# Patient Record
Sex: Female | Born: 1975 | Race: Black or African American | Hispanic: No | Marital: Single | Smoking: Light tobacco smoker
Health system: Southern US, Community
[De-identification: ages and names within clinical notes are randomized; demographics above are authoritative.]

## PROBLEM LIST (undated history)

## (undated) DIAGNOSIS — F329 Major depressive disorder, single episode, unspecified: Secondary | ICD-10-CM

## (undated) DIAGNOSIS — F32A Depression, unspecified: Secondary | ICD-10-CM

## (undated) DIAGNOSIS — C259 Malignant neoplasm of pancreas, unspecified: Secondary | ICD-10-CM

## (undated) DIAGNOSIS — F419 Anxiety disorder, unspecified: Secondary | ICD-10-CM

## (undated) DIAGNOSIS — M549 Dorsalgia, unspecified: Secondary | ICD-10-CM

## (undated) HISTORY — PX: TUBAL LIGATION: SHX77

## (undated) HISTORY — PX: DILATION AND CURETTAGE OF UTERUS: SHX78

## (undated) HISTORY — PX: PANCREAS SURGERY: SHX731

---

## 2009-12-10 ENCOUNTER — Emergency Department (HOSPITAL_BASED_OUTPATIENT_CLINIC_OR_DEPARTMENT_OTHER): Admission: EM | Admit: 2009-12-10 | Discharge: 2009-12-10 | Payer: Self-pay | Admitting: Emergency Medicine

## 2009-12-16 ENCOUNTER — Emergency Department (HOSPITAL_BASED_OUTPATIENT_CLINIC_OR_DEPARTMENT_OTHER): Admission: EM | Admit: 2009-12-16 | Discharge: 2009-12-16 | Payer: Self-pay | Admitting: Emergency Medicine

## 2010-01-19 ENCOUNTER — Emergency Department (HOSPITAL_BASED_OUTPATIENT_CLINIC_OR_DEPARTMENT_OTHER): Admission: EM | Admit: 2010-01-19 | Discharge: 2010-01-20 | Payer: Self-pay | Admitting: Emergency Medicine

## 2010-01-21 ENCOUNTER — Inpatient Hospital Stay (HOSPITAL_COMMUNITY): Admission: AD | Admit: 2010-01-21 | Discharge: 2010-01-21 | Payer: Self-pay | Admitting: Obstetrics & Gynecology

## 2011-02-17 LAB — RAPID STREP SCREEN (MED CTR MEBANE ONLY): Streptococcus, Group A Screen (Direct): NEGATIVE

## 2011-02-21 LAB — URINALYSIS, ROUTINE W REFLEX MICROSCOPIC
Bilirubin Urine: NEGATIVE
Glucose, UA: NEGATIVE mg/dL
Hgb urine dipstick: NEGATIVE
Nitrite: NEGATIVE
Protein, ur: NEGATIVE mg/dL
Urobilinogen, UA: 1 mg/dL (ref 0.0–1.0)

## 2011-02-22 LAB — CBC
HCT: 37 % (ref 36.0–46.0)
Hemoglobin: 12.1 g/dL (ref 12.0–15.0)
RBC: 3.83 MIL/uL — ABNORMAL LOW (ref 3.87–5.11)
WBC: 10.3 10*3/uL (ref 4.0–10.5)

## 2011-02-22 LAB — WET PREP, GENITAL: Trich, Wet Prep: NONE SEEN

## 2011-02-22 LAB — GC/CHLAMYDIA PROBE AMP, GENITAL: GC Probe Amp, Genital: NEGATIVE

## 2012-09-10 ENCOUNTER — Emergency Department (HOSPITAL_BASED_OUTPATIENT_CLINIC_OR_DEPARTMENT_OTHER)
Admission: EM | Admit: 2012-09-10 | Discharge: 2012-09-10 | Disposition: A | Discharge status: Home / Self Care | Payer: Self-pay | Attending: Emergency Medicine | Admitting: Emergency Medicine

## 2012-09-10 ENCOUNTER — Encounter (HOSPITAL_BASED_OUTPATIENT_CLINIC_OR_DEPARTMENT_OTHER): Payer: Self-pay | Admitting: *Deleted

## 2012-09-10 ENCOUNTER — Inpatient Hospital Stay (HOSPITAL_COMMUNITY): Payer: Self-pay

## 2012-09-10 ENCOUNTER — Encounter (HOSPITAL_COMMUNITY): Payer: Self-pay | Admitting: *Deleted

## 2012-09-10 ENCOUNTER — Inpatient Hospital Stay (HOSPITAL_COMMUNITY)
Admission: AD | Admit: 2012-09-10 | Discharge: 2012-09-10 | Disposition: A | Discharge status: Home / Self Care | Payer: Self-pay | Source: Ambulatory Visit | Attending: Obstetrics & Gynecology | Admitting: Obstetrics & Gynecology

## 2012-09-10 DIAGNOSIS — O2390 Unspecified genitourinary tract infection in pregnancy, unspecified trimester: Secondary | ICD-10-CM | POA: Insufficient documentation

## 2012-09-10 DIAGNOSIS — A599 Trichomoniasis, unspecified: Secondary | ICD-10-CM | POA: Insufficient documentation

## 2012-09-10 DIAGNOSIS — O200 Threatened abortion: Secondary | ICD-10-CM | POA: Insufficient documentation

## 2012-09-10 DIAGNOSIS — O98819 Other maternal infectious and parasitic diseases complicating pregnancy, unspecified trimester: Secondary | ICD-10-CM | POA: Insufficient documentation

## 2012-09-10 DIAGNOSIS — A5901 Trichomonal vulvovaginitis: Secondary | ICD-10-CM | POA: Insufficient documentation

## 2012-09-10 DIAGNOSIS — O039 Complete or unspecified spontaneous abortion without complication: Secondary | ICD-10-CM

## 2012-09-10 DIAGNOSIS — N760 Acute vaginitis: Secondary | ICD-10-CM | POA: Insufficient documentation

## 2012-09-10 HISTORY — DX: Malignant neoplasm of pancreas, unspecified: C25.9

## 2012-09-10 HISTORY — DX: Dorsalgia, unspecified: M54.9

## 2012-09-10 LAB — WET PREP, GENITAL
Clue Cells Wet Prep HPF POC: NONE SEEN
Yeast Wet Prep HPF POC: NONE SEEN

## 2012-09-10 LAB — CBC WITH DIFFERENTIAL/PLATELET
Basophils Relative: 0 % (ref 0–1)
Eosinophils Absolute: 0.3 10*3/uL (ref 0.0–0.7)
Eosinophils Relative: 2 % (ref 0–5)
HCT: 33.2 % — ABNORMAL LOW (ref 36.0–46.0)
Lymphocytes Relative: 32 % (ref 12–46)
MCH: 31.8 pg (ref 26.0–34.0)
MCV: 90.2 fL (ref 78.0–100.0)
Monocytes Absolute: 0.9 10*3/uL (ref 0.1–1.0)
RDW: 13.4 % (ref 11.5–15.5)
WBC: 11.1 10*3/uL — ABNORMAL HIGH (ref 4.0–10.5)

## 2012-09-10 LAB — HCG, QUANTITATIVE, PREGNANCY: hCG, Beta Chain, Quant, S: 1855 m[IU]/mL — ABNORMAL HIGH (ref <5)

## 2012-09-10 LAB — URINALYSIS, ROUTINE W REFLEX MICROSCOPIC
Nitrite: NEGATIVE
Specific Gravity, Urine: 1.008 (ref 1.005–1.030)
Urobilinogen, UA: 1 mg/dL (ref 0.0–1.0)

## 2012-09-10 LAB — BASIC METABOLIC PANEL
BUN: 8 mg/dL (ref 6–23)
CO2: 19 mEq/L (ref 19–32)
GFR calc Af Amer: >90 mL/min (ref >90)
Glucose, Bld: 116 mg/dL — ABNORMAL HIGH (ref 70–99)
Potassium: 3.5 mEq/L (ref 3.5–5.1)

## 2012-09-10 LAB — URINE MICROSCOPIC-ADD ON

## 2012-09-10 LAB — GC/CHLAMYDIA PROBE AMP, GENITAL
Chlamydia, DNA Probe: NEGATIVE
GC Probe Amp, Genital: NEGATIVE

## 2012-09-10 LAB — ABO/RH: ABO/RH(D): B POS

## 2012-09-10 MED ORDER — METRONIDAZOLE 500 MG PO TABS
2000.0000 mg | ORAL_TABLET | Freq: Once | ORAL | Status: AC
  Administered 2012-09-10: 2000 mg via ORAL
  Filled 2012-09-10: qty 4

## 2012-09-10 MED ORDER — AZITHROMYCIN 1 G PO PACK
1.0000 g | PACK | Freq: Once | ORAL | Status: AC
  Administered 2012-09-10: 1 g via ORAL
  Filled 2012-09-10: qty 1

## 2012-09-10 MED ORDER — CEFTRIAXONE SODIUM 250 MG IJ SOLR
250.0000 mg | Freq: Once | INTRAMUSCULAR | Status: AC
  Administered 2012-09-10: 250 mg via INTRAMUSCULAR
  Filled 2012-09-10: qty 250

## 2012-09-10 MED ORDER — KETOROLAC TROMETHAMINE 60 MG/2ML IM SOLN
30.0000 mg | Freq: Once | INTRAMUSCULAR | Status: AC
  Administered 2012-09-10: 30 mg via INTRAMUSCULAR
  Filled 2012-09-10: qty 2

## 2012-09-10 NOTE — MAU Provider Note (Signed)
History     CSN: [id number]  Arrival date and time: 09/10/12 0210   None     Chief Complaint  Patient presents with  . Vaginal Bleeding   HPI Ashlee Coleman is 36 y.o. G1P0 Unknown weeks presenting after evaluation at [facility] for heavy vaginal bleeding in early pregnancy.  All labs, exam, and treatment for Trichomonas, Gc/Chl were done prior to her arrival per report of Dr. Nicanor Alcon to me.  All labs are now back.  She is here for ultrasound evaluation.  Patient states she passed a large amount of something and now her bleeding has slowed and she is having only back pain.  SHe is stable for ultrasound.    Past Medical History  Diagnosis Date  . Pancreatic cancer   . Back pain     Past Surgical History  Procedure Date  . Dilation and curettage of uterus     Family History  Problem Relation Age of Onset  . Hypertension Father     History  Substance Use Topics  . Smoking status: Current Every Day Smoker -- 0.5 packs/day  . Smokeless tobacco: Not on file  . Alcohol Use: No    Allergies: No Known Allergies  Prescriptions prior to admission  Medication Sig Dispense Refill  . gabapentin (NEURONTIN) 100 MG capsule Take 100 mg by mouth 3 (three) times daily.        Review of Systems  Constitutional: Negative.   HENT: Negative.   Respiratory: Negative.   Cardiovascular: Negative.   Genitourinary:       + for heavy vaginal bleeding with ? Tissue/clot  Musculoskeletal: Positive for back pain.   Physical Exam   Blood pressure 125/85, pulse 89, temperature 97.1 F (36.2 C), temperature source Oral, resp. rate 18, last menstrual period 07/11/2012.  Physical Exam  Constitutional: She is oriented to person, place, and time. She appears well-developed and well-nourished. No distress.  HENT:  Head: Normocephalic.  Neck: Normal range of motion.  Cardiovascular: Normal rate.   Respiratory: Effort normal.  GI: There is no tenderness. There is no  rebound and no guarding.  Genitourinary: Uterus is enlarged (slightly, soft). Uterus is not tender. Cervix exhibits discharge. Cervix exhibits no motion tenderness and no friability. Right adnexum displays no mass, no tenderness and no fullness. Left adnexum displays no mass, no tenderness and no fullness. There is bleeding (scant blood in vaginal canal--less than one scopette full) around the vagina. No tenderness around the vagina.  Musculoskeletal:       Lower back pain  Neurological: She is alert and oriented to person, place, and time.  Skin: Skin is warm and dry.  Psychiatric: She has a normal mood and affect. Her behavior is normal.   Results for orders placed during the hospital encounter of 09/10/12 (from the past 24 hour(s))  PREGNANCY, URINE     Status: Abnormal   Collection Time   09/10/12 12:35 AM      Component Value Range   Preg Test, Ur POSITIVE (*) NEGATIVE  URINALYSIS, ROUTINE W REFLEX MICROSCOPIC     Status: Abnormal   Collection Time   09/10/12 12:35 AM      Component Value Range   Color, Urine YELLOW  YELLOW   APPearance CLOUDY (*) CLEAR   Specific Gravity, Urine 1.008  1.005 - 1.030   pH 6.0  5.0 - 8.0   Glucose, UA NEGATIVE  NEGATIVE mg/dL   Hgb urine dipstick LARGE (*) NEGATIVE  Bilirubin Urine NEGATIVE  NEGATIVE   Ketones, ur NEGATIVE  NEGATIVE mg/dL   Protein, ur NEGATIVE  NEGATIVE mg/dL   Urobilinogen, UA 1.0  0.0 - 1.0 mg/dL   Nitrite NEGATIVE  NEGATIVE   Leukocytes, UA MODERATE (*) NEGATIVE  URINE MICROSCOPIC-ADD ON     Status: Abnormal   Collection Time   09/10/12 12:35 AM      Component Value Range   Squamous Epithelial / LPF FEW (*) RARE   WBC, UA 7-10  <3 WBC/hpf   RBC / HPF 21-50  <3 RBC/hpf   Bacteria, UA MANY (*) RARE   Urine-Other TRICHOMONAS PRESENT    WET PREP, GENITAL     Status: Abnormal   Collection Time   09/10/12 12:43 AM      Component Value Range   Yeast Wet Prep HPF POC NONE SEEN  NONE SEEN   Trich, Wet Prep FEW (*) NONE SEEN    Clue Cells Wet Prep HPF POC NONE SEEN  NONE SEEN   WBC, Wet Prep HPF POC FEW (*) NONE SEEN  CBC WITH DIFFERENTIAL     Status: Abnormal   Collection Time   09/10/12  1:04 AM      Component Value Range   WBC 11.1 (*) 4.0 - 10.5 K/uL   RBC 3.68 (*) 3.87 - 5.11 MIL/uL   Hemoglobin 11.7 (*) 12.0 - 15.0 g/dL   HCT 40.9 (*) 81.1 - 91.4 %   MCV 90.2  78.0 - 100.0 fL   MCH 31.8  26.0 - 34.0 pg   MCHC 35.2  30.0 - 36.0 g/dL   RDW 78.2  95.6 - 21.3 %   Platelets 405 (*) 150 - 400 K/uL   Neutrophils Relative 57  43 - 77 %   Neutro Abs 6.3  1.7 - 7.7 K/uL   Lymphocytes Relative 32  12 - 46 %   Lymphs Abs 3.6  0.7 - 4.0 K/uL   Monocytes Relative 8  3 - 12 %   Monocytes Absolute 0.9  0.1 - 1.0 K/uL   Eosinophils Relative 2  0 - 5 %   Eosinophils Absolute 0.3  0.0 - 0.7 K/uL   Basophils Relative 0  0 - 1 %   Basophils Absolute 0.0  0.0 - 0.1 K/uL  BASIC METABOLIC PANEL     Status: Abnormal   Collection Time   09/10/12  1:04 AM      Component Value Range   Sodium 137  135 - 145 mEq/L   Potassium 3.5  3.5 - 5.1 mEq/L   Chloride 103  96 - 112 mEq/L   CO2 19  19 - 32 mEq/L   Glucose, Bld 116 (*) 70 - 99 mg/dL   BUN 8  6 - 23 mg/dL   Creatinine, Ser 0.86  0.50 - 1.10 mg/dL   Calcium 9.1  8.4 - 57.8 mg/dL   GFR calc non Af Amer >90  >90 mL/min   GFR calc Af Amer >90  >90 mL/min  HCG, QUANTITATIVE, PREGNANCY     Status: Abnormal   Collection Time   09/10/12  1:05 AM      Component Value Range   hCG, Beta Chain, Quant, S 1855 (*) <5 mIU/mL   Clinical Data: 36 year old female with bleeding. Quantitative beta  HCG 1855.  OBSTETRIC <14 WK [place] AND TRANSVAGINAL OB US  Technique: Both transabdominal and transvaginal ultrasound  examinations were performed for complete evaluation of the  gestation as well as the maternal  uterus, adnexal regions, and  pelvic cul-de-sac. Transvaginal technique was performed to assess  early pregnancy.  Comparison: None relevant.  Intrauterine gestational sac:  None  Yolk sac: None  Embryo: None  Cardiac Activity: None  Maternal uterus/adnexae:  No pelvic free fluid. Fairly homogeneous endometrial stripe up to  12 mm in thickness. The left ovary appears normal measuring 2.1 x  3.9 x 1.8 cm. The right ovary appears normal measuring 2.7 x 1.2 x  1.8 cm. Incidental Nabothian cyst of the cervix.  IMPRESSION:  No IUP, free fluid, or adnexal mass identified. Ectopic pregnancy  not excluded. Recommend correlation with serial quantitative BHCG  and followup imaging.  Original Report Authenticated By: Harley Hallmark, M.D.     MAU Course  Procedures  MDM Patient was treated for Trichomonas, GC/CHL [facility]   After ultrasound results--Toradol 30mg  IM was ordered for back pain.   Assessment and Plan  A:  Heavy vaginal bleeding in early pregnancy      Spontaneous miscarriage     Trichomonas vaginitis-treated at [facility]        P:  Follow up for repeat BHCG in 2 weeks in the GYN CLINIC    Instructed to call for heavier bleeding or increased pain     Pelvic rest until bleeding stops   Graham Hyun,EVE M 09/10/2012, 2:25 AM

## 2012-09-10 NOTE — ED Provider Notes (Signed)
History     CSN: [id number]  Arrival date & time 09/10/12  0019   First MD Initiated Contact with Patient 09/10/12 0032      Chief Complaint  Patient presents with  . Vaginal Bleeding    (Consider location/radiation/quality/duration/timing/severity/associated sxs/prior treatment) Patient is a 36 y.o. female presenting with vaginal bleeding. The history is provided by the patient. No language interpreter was used.  Vaginal Bleeding This is a new problem. The current episode started more than 2 days ago. The problem occurs constantly. The problem has been gradually worsening. Pertinent negatives include no chest pain, no headaches and no shortness of breath. Nothing aggravates the symptoms. Nothing relieves the symptoms. She has tried nothing for the symptoms. The treatment provided no relief.  Had a missed period on September 11 and is usually regular.  Increased bleeding and soaked 12 pads today.    Past Medical History  Diagnosis Date  . Pancreatic cancer   . Back pain     Past Surgical History  Procedure Date  . Dilation and curettage of uterus     History reviewed. No pertinent family history.  History  Substance Use Topics  . Smoking status: Current Every Day Smoker -- 0.5 packs/day  . Smokeless tobacco: Not on file  . Alcohol Use: No    OB History    Grav Para Term Preterm Abortions TAB SAB Ect Mult Living                  Review of Systems  Respiratory: Negative for shortness of breath.   Cardiovascular: Negative for chest pain.  Genitourinary: Positive for vaginal bleeding.  Neurological: Negative for headaches.  All other systems reviewed and are negative.    Allergies  Review of patient's allergies indicates no known allergies.  Home Medications   Current Outpatient Rx  Name Route Sig Dispense Refill  . GABAPENTIN 100 MG PO CAPS Oral Take 100 mg by mouth 3 (three) times daily.      BP 122/88  Pulse 92  Temp 98.2 F (36.8 C) (Oral)  Resp  16  Ht 5\' 5"  (1.651 m)  Wt 196 lb (88.905 kg)  BMI 32.62 kg/m2  SpO2 98%  LMP 07/11/2012  Physical Exam  Constitutional: She is oriented to person, place, and time. She appears well-developed and well-nourished.  HENT:  Head: Normocephalic and atraumatic.  Mouth/Throat: Oropharynx is clear and moist.  Eyes: Conjunctivae normal are normal. Pupils are equal, round, and reactive to light.  Neck: Normal range of motion. Neck supple.  Cardiovascular: Normal rate and regular rhythm.   Pulmonary/Chest: Effort normal and breath sounds normal. She has no wheezes. She has no rales.  Abdominal: Soft. Bowel sounds are normal. There is no tenderness. There is no rebound and no guarding.  Genitourinary:       Scant vaginal bleeding os closed.  Chaperone present  Musculoskeletal: Normal range of motion.  Neurological: She is oriented to person, place, and time.  Skin: Skin is warm and dry.  Psychiatric: She has a normal mood and affect.    ED Course  Procedures (including critical care time)  Labs Reviewed  PREGNANCY, URINE - Abnormal; Notable for the following:    Preg Test, Ur POSITIVE (*)     All other components within normal limits  WET PREP, GENITAL - Abnormal; Notable for the following:    Trich, Wet Prep FEW (*)     WBC, Wet Prep HPF POC FEW (*)     All  other components within normal limits  URINALYSIS, ROUTINE W REFLEX MICROSCOPIC - Abnormal; Notable for the following:    APPearance CLOUDY (*)     Hgb urine dipstick LARGE (*)     Leukocytes, UA MODERATE (*)     All other components within normal limits  URINE MICROSCOPIC-ADD ON - Abnormal; Notable for the following:    Squamous Epithelial / LPF FEW (*)     Bacteria, UA MANY (*)     All other components within normal limits  GC/CHLAMYDIA PROBE AMP, GENITAL  URINE CULTURE  CBC WITH DIFFERENTIAL  BASIC METABOLIC PANEL  ABO/RH  HCG, QUANTITATIVE, PREGNANCY   No results found.   No diagnosis found.    MDM  Case d/w  Dr. Despina Hidden  And Eve in MAU.  Have treated for GC chlamydia and trichomonas.  Quant and RH pending.  Send for [place] for r/o ectopic        Koriana Stepien K Coree Brame-Rasch, MD 09/10/12 0128

## 2012-09-10 NOTE — ED Notes (Signed)
Pt states she is pregnant with no visit by MD only missed period , LMP sept 11. C/o vaginal bleeding x 2 days

## 2012-09-10 NOTE — MAU Note (Signed)
Pt sent from ER for ultrasound

## 2012-09-11 LAB — URINE CULTURE: Culture: NO GROWTH

## 2012-09-14 ENCOUNTER — Encounter (HOSPITAL_COMMUNITY): Payer: Self-pay | Admitting: *Deleted

## 2012-09-14 ENCOUNTER — Inpatient Hospital Stay (HOSPITAL_COMMUNITY)
Admission: AD | Admit: 2012-09-14 | Discharge: 2012-09-14 | Disposition: A | Discharge status: Home / Self Care | Payer: Self-pay | Source: Ambulatory Visit | Attending: Obstetrics and Gynecology | Admitting: Obstetrics and Gynecology

## 2012-09-14 DIAGNOSIS — O039 Complete or unspecified spontaneous abortion without complication: Secondary | ICD-10-CM

## 2012-09-14 DIAGNOSIS — R42 Dizziness and giddiness: Secondary | ICD-10-CM | POA: Insufficient documentation

## 2012-09-14 HISTORY — DX: Major depressive disorder, single episode, unspecified: F32.9

## 2012-09-14 HISTORY — DX: Depression, unspecified: F32.A

## 2012-09-14 HISTORY — DX: Anxiety disorder, unspecified: F41.9

## 2012-09-14 LAB — COMPREHENSIVE METABOLIC PANEL
Alkaline Phosphatase: 67 U/L (ref 39–117)
BUN: 10 mg/dL (ref 6–23)
CO2: 21 mEq/L (ref 19–32)
Calcium: 8.7 mg/dL (ref 8.4–10.5)
GFR calc Af Amer: >90 mL/min (ref >90)
GFR calc non Af Amer: >90 mL/min (ref >90)
Glucose, Bld: 123 mg/dL — ABNORMAL HIGH (ref 70–99)
Potassium: 4.1 mEq/L (ref 3.5–5.1)
Total Protein: 6.8 g/dL (ref 6.0–8.3)

## 2012-09-14 LAB — URINALYSIS, ROUTINE W REFLEX MICROSCOPIC
Protein, ur: NEGATIVE mg/dL
Urobilinogen, UA: 0.2 mg/dL (ref 0.0–1.0)

## 2012-09-14 LAB — CBC
HCT: 35.3 % — ABNORMAL LOW (ref 36.0–46.0)
Hemoglobin: 11.8 g/dL — ABNORMAL LOW (ref 12.0–15.0)
MCH: 30.8 pg (ref 26.0–34.0)
MCHC: 33.4 g/dL (ref 30.0–36.0)
RBC: 3.83 MIL/uL — ABNORMAL LOW (ref 3.87–5.11)

## 2012-09-14 LAB — URINE MICROSCOPIC-ADD ON

## 2012-09-14 MED ORDER — PROMETHAZINE HCL 25 MG PO TABS: 12.5000 mg | ORAL_TABLET | Freq: Four times a day (QID) | ORAL | Status: AC | PRN

## 2012-09-14 MED ORDER — PROMETHAZINE HCL 25 MG/ML IJ SOLN
25.0000 mg | Freq: Once | INTRAVENOUS | Status: AC
  Administered 2012-09-14: 25 mg via INTRAVENOUS
  Filled 2012-09-14: qty 1

## 2012-09-14 NOTE — MAU Note (Signed)
Pt seen in MAU earlier this month, dx'd with miscarriage.  Still feeling weak, lightheaded, N&V.  Was dx'd with trichomonas at [facility], was treated, but still having itching.  Light bleeding continues, some intermittent abd pain.

## 2012-09-14 NOTE — Progress Notes (Signed)
Patient states she has not had a BM in 2 weeks. States she has a hx of constipation, but does not usually go this long. Has not tried any interventions. States she had the "tip of her pancreas removed" when she was dx with pancreatic cancer at age 36. Has been irergular with BM's since.

## 2012-09-14 NOTE — MAU Provider Note (Signed)
Chief Complaint: Dizziness and Fatigue   First Provider Initiated Contact with Patient 09/14/12 1416     SUBJECTIVE HPI: Ashlee Coleman is a 36 y.o. [id number] who presents to maternity admissions reporting dizziness and fatigue x3-4 days.  She was seen in MAU on 09/10/12 and diagnosed with spontaneous miscarriage.  She has continued to have vaginal bleeding but it has gotten lighter since her visit to MAU.  She reports scant vaginal bleeding, and denies vaginal itching/burning, urinary symptoms, h/a, dizziness, n/v, or fever/chills.     Past Medical History  Diagnosis Date  . Pancreatic cancer   . Back pain   . Anxiety   . Depression     No current treatment   Past Surgical History  Procedure Date  . Dilation and curettage of uterus   . Pancreas surgery     "Removed tip of pancreas"   History   Social History  . Marital Status: Single    Spouse Name: N/A    Number of Children: N/A  . Years of Education: N/A   Occupational History  . Not on file.   Social History Main Topics  . Smoking status: Current Every Day Smoker -- 0.2 packs/day    Types: Cigarettes  . Smokeless tobacco: Never Used  . Alcohol Use: Yes     Occas.  . Drug Use: No  . Sexually Active: No   Other Topics Concern  . Not on file   Social History Narrative  . No narrative on file   No current facility-administered medications on file prior to encounter.   Current Outpatient Prescriptions on File Prior to Encounter  Medication Sig Dispense Refill  . gabapentin (NEURONTIN) 100 MG capsule Take 100 mg by mouth 3 (three) times daily.       No Known Allergies  ROS: Pertinent items in HPI  OBJECTIVE Blood pressure 114/75, pulse 84, temperature 97.9 F (36.6 C), temperature source Oral, resp. rate 18, height 5\' 6"  (1.676 m), weight 99.428 kg (219 lb 3.2 oz), last menstrual period 07/11/2012, SpO2 100.00%. GENERAL: Well-developed, well-nourished female in no acute distress.  HEENT:  Normocephalic HEART: normal rate RESP: normal effort ABDOMEN: Soft, non-tender EXTREMITIES: Nontender, no edema NEURO: Alert and oriented SPECULUM EXAM: Deferred  LAB RESULTS Results for orders placed during the hospital encounter of 09/14/12 (from the past 24 hour(s))  URINALYSIS, ROUTINE W REFLEX MICROSCOPIC     Status: Abnormal   Collection Time   09/14/12  1:20 PM      Component Value Range   Color, Urine YELLOW  YELLOW   APPearance CLEAR  CLEAR   Specific Gravity, Urine 1.015  1.005 - 1.030   pH 6.5  5.0 - 8.0   Glucose, UA NEGATIVE  NEGATIVE mg/dL   Hgb urine dipstick LARGE (*) NEGATIVE   Bilirubin Urine NEGATIVE  NEGATIVE   Ketones, ur NEGATIVE  NEGATIVE mg/dL   Protein, ur NEGATIVE  NEGATIVE mg/dL   Urobilinogen, UA 0.2  0.0 - 1.0 mg/dL   Nitrite NEGATIVE  NEGATIVE   Leukocytes, UA TRACE (*) NEGATIVE  URINE MICROSCOPIC-ADD ON     Status: Abnormal   Collection Time   09/14/12  1:20 PM      Component Value Range   Squamous Epithelial / LPF FEW (*) RARE   WBC, UA 3-6  <3 WBC/hpf   RBC / HPF 7-10  <3 RBC/hpf   Bacteria, UA FEW (*) RARE  HCG, QUANTITATIVE, PREGNANCY     Status: Abnormal   Collection Time  09/14/12  1:52 PM      Component Value Range   hCG, Beta Chain, Quant, S 64 (*) <5 mIU/mL  CBC     Status: Abnormal   Collection Time   09/14/12  1:52 PM      Component Value Range   WBC 7.3  4.0 - 10.5 K/uL   RBC 3.83 (*) 3.87 - 5.11 MIL/uL   Hemoglobin 11.8 (*) 12.0 - 15.0 g/dL   HCT 16.1 (*) 09.6 - 04.5 %   MCV 92.2  78.0 - 100.0 fL   MCH 30.8  26.0 - 34.0 pg   MCHC 33.4  30.0 - 36.0 g/dL   RDW 40.9  81.1 - 91.4 %   Platelets 435 (*) 150 - 400 K/uL  COMPREHENSIVE METABOLIC PANEL     Status: Abnormal   Collection Time   09/14/12  1:52 PM      Component Value Range   Sodium 138  135 - 145 mEq/L   Potassium 4.1  3.5 - 5.1 mEq/L   Chloride 104  96 - 112 mEq/L   CO2 21  19 - 32 mEq/L   Glucose, Bld 123 (*) 70 - 99 mg/dL   BUN 10  6 - 23 mg/dL    Creatinine, Ser 7.82  0.50 - 1.10 mg/dL   Calcium 8.7  8.4 - 95.6 mg/dL   Total Protein 6.8  6.0 - 8.3 g/dL   Albumin 3.6  3.5 - 5.2 g/dL   AST 17  0 - 37 U/L   ALT 13  0 - 35 U/L   Alkaline Phosphatase 67  39 - 117 U/L   Total Bilirubin 0.4  0.3 - 1.2 mg/dL   GFR calc non Af Amer >90  >90 mL/min   GFR calc Af Amer >90  >90 mL/min     ASSESSMENT 1. SAB (spontaneous abortion)   2. Dizziness     PLAN LR 1000 ml plus Phenergan 25 mg IV x1 dose in MAU--Pt reports full relief of symptoms Discharge home Phenergan 12.5 mg Q6 hours PRN nausea Increase PO fluids Call Gyn clinic for appointment in 1 week for f/u labs (message sent to clinic at last visit) Return to MAU as needed    Medication List     As of 09/14/2012  4:09 PM    TAKE these medications         acetaminophen 500 MG tablet   Commonly known as: TYLENOL   Take 500 mg by mouth every 6 (six) hours as needed. Back and headache pain      BIOTIN 5000 PO   Take by mouth. Hair, nails, and skin      gabapentin 100 MG capsule   Commonly known as: NEURONTIN   Take 100 mg by mouth 3 (three) times daily. seizures      multivitamin tablet   Take 1 tablet by mouth daily.      promethazine 25 MG tablet   Commonly known as: PHENERGAN   Take 0.5 tablets (12.5 mg total) by mouth every 6 (six) hours as needed for nausea.      vitamin E 100 UNIT capsule   Take 100 Units by mouth daily.           Sharen Counter Certified Nurse-Midwife 09/14/2012  2:34 PM

## 2012-09-15 NOTE — MAU Provider Note (Signed)
Attestation of Attending Supervision of Advanced Practitioner (CNM/NP): Evaluation and management procedures were performed by the Advanced Practitioner under my supervision and collaboration.  I have reviewed the Advanced Practitioner's note and chart, and I agree with the management and plan.  Janeva Peaster 09/15/2012 5:25 AM   

## 2014-10-03 ENCOUNTER — Encounter (HOSPITAL_COMMUNITY): Payer: Self-pay | Admitting: *Deleted

## 2014-12-21 ENCOUNTER — Encounter (HOSPITAL_BASED_OUTPATIENT_CLINIC_OR_DEPARTMENT_OTHER): Payer: Self-pay | Admitting: *Deleted

## 2014-12-21 ENCOUNTER — Emergency Department (HOSPITAL_BASED_OUTPATIENT_CLINIC_OR_DEPARTMENT_OTHER)
Admission: EM | Admit: 2014-12-21 | Discharge: 2014-12-21 | Disposition: A | Discharge status: Home / Self Care | Payer: Self-pay | Attending: Emergency Medicine | Admitting: Emergency Medicine

## 2014-12-21 ENCOUNTER — Emergency Department (HOSPITAL_BASED_OUTPATIENT_CLINIC_OR_DEPARTMENT_OTHER): Payer: Self-pay

## 2014-12-21 DIAGNOSIS — H66002 Acute suppurative otitis media without spontaneous rupture of ear drum, left ear: Secondary | ICD-10-CM | POA: Insufficient documentation

## 2014-12-21 DIAGNOSIS — R05 Cough: Secondary | ICD-10-CM

## 2014-12-21 DIAGNOSIS — R059 Cough, unspecified: Secondary | ICD-10-CM

## 2014-12-21 DIAGNOSIS — Z792 Long term (current) use of antibiotics: Secondary | ICD-10-CM | POA: Insufficient documentation

## 2014-12-21 DIAGNOSIS — F419 Anxiety disorder, unspecified: Secondary | ICD-10-CM | POA: Insufficient documentation

## 2014-12-21 DIAGNOSIS — Z8507 Personal history of malignant neoplasm of pancreas: Secondary | ICD-10-CM | POA: Insufficient documentation

## 2014-12-21 DIAGNOSIS — Z720 Tobacco use: Secondary | ICD-10-CM | POA: Insufficient documentation

## 2014-12-21 DIAGNOSIS — J069 Acute upper respiratory infection, unspecified: Secondary | ICD-10-CM | POA: Insufficient documentation

## 2014-12-21 DIAGNOSIS — M791 Myalgia: Secondary | ICD-10-CM | POA: Insufficient documentation

## 2014-12-21 DIAGNOSIS — Z79899 Other long term (current) drug therapy: Secondary | ICD-10-CM | POA: Insufficient documentation

## 2014-12-21 MED ORDER — BENZONATATE 100 MG PO CAPS: 100.0000 mg | ORAL_CAPSULE | Freq: Three times a day (TID) | ORAL | Status: DC

## 2014-12-21 MED ORDER — AMOXICILLIN 500 MG PO CAPS: 500.0000 mg | ORAL_CAPSULE | Freq: Three times a day (TID) | ORAL | Status: DC

## 2014-12-21 NOTE — ED Provider Notes (Signed)
CSN: [id number]     Arrival date & time 12/21/14  1444 History   First MD Initiated Contact with Patient 12/21/14 1513     Chief Complaint  Patient presents with  . URI     Patient is a 39 y.o. female presenting with URI.  URI Presenting symptoms: congestion, cough, ear pain and sore throat   Severity:  Moderate Duration:  3 weeks Timing:  Constant Progression:  Unchanged Relieved by:  Nothing Ineffective treatments:  OTC medications Associated symptoms: myalgias and wheezing   Pain is now in the left ear.    Past Medical History  Diagnosis Date  . Pancreatic cancer   . Back pain   . Anxiety   . Depression     No current treatment   Past Surgical History  Procedure Laterality Date  . Dilation and curettage of uterus    . Pancreas surgery      "Removed tip of pancreas"  . Tubal ligation     Family History  Problem Relation Age of Onset  . Hypertension Father    History  Substance Use Topics  . Smoking status: Current Some Day Smoker -- 0.25 packs/day    Types: Cigarettes  . Smokeless tobacco: Never Used  . Alcohol Use: Yes     Comment: Occas.   OB History    Gravida Para Term Preterm AB TAB SAB Ectopic Multiple Living   6 3 3  3 2 1   3      Review of Systems  HENT: Positive for congestion, ear pain and sore throat.   Respiratory: Positive for cough and wheezing.   Musculoskeletal: Positive for myalgias.  All other systems reviewed and are negative.     Allergies  Review of patient's allergies indicates no known allergies.  Home Medications   Prior to Admission medications   Medication Sig Start Date End Date Taking? Authorizing Provider  acetaminophen (TYLENOL) 500 MG tablet Take 500 mg by mouth every 6 (six) hours as needed. Back and headache pain    Historical Provider, MD  amoxicillin (AMOXIL) 500 MG capsule Take 1 capsule (500 mg total) by mouth 3 (three) times daily. 12/21/14   Dorie Rank, MD  benzonatate (TESSALON) 100 MG capsule Take 1 capsule  (100 mg total) by mouth every 8 (eight) hours. 12/21/14   Dorie Rank, MD  BIOTIN 5000 PO Take by mouth. Hair, nails, and skin    Historical Provider, MD  gabapentin (NEURONTIN) 100 MG capsule Take 100 mg by mouth 3 (three) times daily. seizures    Historical Provider, MD  Multiple Vitamin (MULTIVITAMIN) tablet Take 1 tablet by mouth daily.    Historical Provider, MD  promethazine (PHENERGAN) 25 MG tablet Take 0.5 tablets (12.5 mg total) by mouth every 6 (six) hours as needed for nausea. 09/14/12   Lisa A Leftwich-Kirby, CNM  vitamin E 100 UNIT capsule Take 100 Units by mouth daily.    Historical Provider, MD   BP 136/96 mmHg  Pulse 88  Temp(Src) 98.6 F (37 C) (Oral)  Resp 18  Ht 5\' 5"  (1.651 m)  Wt 220 lb (99.791 kg)  BMI 36.61 kg/m2  SpO2 97%  LMP 12/06/2014 Physical Exam  Constitutional: She appears well-developed and well-nourished. No distress.  HENT:  Head: Normocephalic and atraumatic.  Right Ear: Tympanic membrane and external ear normal.  Left Ear: External ear normal. Tympanic membrane is injected and erythematous.  Mouth/Throat: No oropharyngeal exudate.  Eyes: Conjunctivae are normal. Right eye exhibits no discharge. Left  eye exhibits no discharge. No scleral icterus.  Neck: Neck supple. No tracheal deviation present.  Cardiovascular: Normal rate, regular rhythm and intact distal pulses.   Pulmonary/Chest: Effort normal and breath sounds normal. No stridor. No respiratory distress. She has no wheezes. She has no rales.  Abdominal: Soft. Bowel sounds are normal. She exhibits no distension. There is no tenderness. There is no rebound and no guarding.  Musculoskeletal: She exhibits no edema or tenderness.  Neurological: She is alert. She has normal strength. No cranial nerve deficit (no facial droop, extraocular movements intact, no slurred speech) or sensory deficit. She exhibits normal muscle tone. She displays no seizure activity. Coordination normal.  Skin: Skin is warm and  dry. No rash noted.  Psychiatric: She has a normal mood and affect.  Nursing note and vitals reviewed.   ED Course  Procedures (including critical care time) Labs Review  MDM   Final diagnoses:  Cough  URI, acute  Acute suppurative otitis media of left ear without spontaneous rupture of tympanic membrane, recurrence not specified    Dc home with oral abx.  At this time there does not appear to be any evidence of an acute emergency medical condition and the patient appears stable for discharge with appropriate outpatient follow up.    Dorie Rank, MD 12/26/14 [phone]

## 2014-12-21 NOTE — Discharge Instructions (Signed)
Cough, Adult  A cough is a reflex. It helps you clear your throat and airways. A cough can help heal your body. A cough can last 2 or 3 weeks (acute) or may last more than 8 weeks (chronic). Some common causes of a cough can include an infection, allergy, or a cold. HOME CARE  Only take medicine as told by your doctor.  If given, take your medicines (antibiotics) as told. Finish them even if you start to feel better.  Use a cold steam vaporizer or humidifier in your home. This can help loosen thick spit (secretions).  Sleep so you are almost sitting up (semi-upright). Use pillows to do this. This helps reduce coughing.  Rest as needed.  Stop smoking if you smoke. GET HELP RIGHT AWAY IF:  You have yellowish-white fluid (pus) in your thick spit.  Your cough gets worse.  Your medicine does not reduce coughing, and you are losing sleep.  You cough up blood.  You have trouble breathing.  Your pain gets worse and medicine does not help.  You have a fever. MAKE SURE YOU:   Understand these instructions.  Will watch your condition.  Will get help right away if you are not doing well or get worse. Document Released: 08/01/2011 Document Revised: 04/04/2014 Document Reviewed: 08/01/2011 [facility] Patient Information 2015 [place], [place]. This information is not intended to replace advice given to you by your health care provider. Make sure you discuss any questions you have with your health care provider.  Otitis Media With Effusion Otitis media with effusion is the presence of fluid in the middle ear. This is a common problem in children, which often follows ear infections. It may be present for weeks or longer after the infection. Unlike an acute ear infection, otitis media with effusion refers only to fluid behind the ear drum and not infection. Children with repeated ear and sinus infections and allergy problems are the most likely to get otitis media with effusion. CAUSES  The  most frequent cause of the fluid buildup is dysfunction of the eustachian tubes. These are the tubes that drain fluid in the ears to the back of the nose (nasopharynx). SYMPTOMS   The main symptom of this condition is hearing loss. As a result, you or your child may:  Listen to the TV at a loud volume.  Not respond to questions.  Ask "what" often when spoken to.  Mistake or confuse one sound or word for another.  There may be a sensation of fullness or pressure but usually not pain. DIAGNOSIS   Your health care provider will diagnose this condition by examining you or your child's ears.  Your health care provider may test the pressure in you or your child's ear with a tympanometer.  A hearing test may be conducted if the problem persists. TREATMENT   Treatment depends on the duration and the effects of the effusion.  Antibiotics, decongestants, nose drops, and cortisone-type drugs (tablets or nasal spray) may not be helpful.  Children with persistent ear effusions may have delayed language or behavioral problems. Children at risk for developmental delays in hearing, learning, and speech may require referral to a specialist earlier than children not at risk.  You or your child's health care provider may suggest a referral to an ear, nose, and throat surgeon for treatment. The following may help restore normal hearing:  Drainage of fluid.  Placement of ear tubes (tympanostomy tubes).  Removal of adenoids (adenoidectomy). HOME CARE INSTRUCTIONS  Avoid secondhand smoke.  Infants who are breastfed are less likely to have this condition.  Avoid feeding infants while they are lying flat.  Avoid known environmental allergens.  Avoid people who are sick. SEEK MEDICAL CARE IF:   Hearing is not better in 3 months.  Hearing is worse.  Ear pain.  Drainage from the ear.  Dizziness. MAKE SURE YOU:   Understand these instructions.  Will watch your condition.  Will get  help right away if you are not doing well or get worse. Document Released: 12/26/2004 Document Revised: 04/04/2014 Document Reviewed: 06/15/2013 [facility] Patient Information 2015 [place], [place]. This information is not intended to replace advice given to you by your health care provider. Make sure you discuss any questions you have with your health care provider.

## 2014-12-21 NOTE — ED Notes (Signed)
Pt c/o URi symptoms  x 3 weeks

## 2015-05-19 ENCOUNTER — Encounter (HOSPITAL_BASED_OUTPATIENT_CLINIC_OR_DEPARTMENT_OTHER): Payer: Self-pay

## 2015-05-19 ENCOUNTER — Emergency Department (HOSPITAL_BASED_OUTPATIENT_CLINIC_OR_DEPARTMENT_OTHER): Payer: BLUE CROSS/BLUE SHIELD

## 2015-05-19 ENCOUNTER — Emergency Department (HOSPITAL_BASED_OUTPATIENT_CLINIC_OR_DEPARTMENT_OTHER)
Admission: EM | Admit: 2015-05-19 | Discharge: 2015-05-19 | Disposition: A | Discharge status: Home / Self Care | Payer: BLUE CROSS/BLUE SHIELD | Attending: Emergency Medicine | Admitting: Emergency Medicine

## 2015-05-19 DIAGNOSIS — F419 Anxiety disorder, unspecified: Secondary | ICD-10-CM | POA: Diagnosis not present

## 2015-05-19 DIAGNOSIS — Z792 Long term (current) use of antibiotics: Secondary | ICD-10-CM | POA: Insufficient documentation

## 2015-05-19 DIAGNOSIS — Z720 Tobacco use: Secondary | ICD-10-CM | POA: Insufficient documentation

## 2015-05-19 DIAGNOSIS — F329 Major depressive disorder, single episode, unspecified: Secondary | ICD-10-CM | POA: Diagnosis not present

## 2015-05-19 DIAGNOSIS — Z79899 Other long term (current) drug therapy: Secondary | ICD-10-CM | POA: Insufficient documentation

## 2015-05-19 DIAGNOSIS — Z8507 Personal history of malignant neoplasm of pancreas: Secondary | ICD-10-CM | POA: Diagnosis not present

## 2015-05-19 DIAGNOSIS — R0789 Other chest pain: Secondary | ICD-10-CM | POA: Diagnosis not present

## 2015-05-19 DIAGNOSIS — R079 Chest pain, unspecified: Secondary | ICD-10-CM | POA: Diagnosis present

## 2015-05-19 LAB — CBC WITH DIFFERENTIAL/PLATELET
BASOS ABS: 0.1 10*3/uL (ref 0.0–0.1)
BASOS PCT: 1 % (ref 0–1)
Eosinophils Absolute: 0.2 10*3/uL (ref 0.0–0.7)
Eosinophils Relative: 2 % (ref 0–5)
HCT: 37.6 % (ref 36.0–46.0)
HEMOGLOBIN: 13 g/dL (ref 12.0–15.0)
Lymphocytes Relative: 24 % (ref 12–46)
Lymphs Abs: 2.1 10*3/uL (ref 0.7–4.0)
MCH: 32.3 pg (ref 26.0–34.0)
MCHC: 34.6 g/dL (ref 30.0–36.0)
MCV: 93.3 fL (ref 78.0–100.0)
MONOS PCT: 10 % (ref 3–12)
Monocytes Absolute: 0.8 10*3/uL (ref 0.1–1.0)
NEUTROS ABS: 5.5 10*3/uL (ref 1.7–7.7)
NEUTROS PCT: 63 % (ref 43–77)
Platelets: 477 10*3/uL — ABNORMAL HIGH (ref 150–400)
RBC: 4.03 MIL/uL (ref 3.87–5.11)
RDW: 12.9 % (ref 11.5–15.5)
WBC: 8.7 10*3/uL (ref 4.0–10.5)

## 2015-05-19 LAB — TROPONIN I
Troponin I: <0.03 ng/mL (ref <0.031)
Troponin I: <0.03 ng/mL (ref <0.031)

## 2015-05-19 LAB — BASIC METABOLIC PANEL
ANION GAP: 11 (ref 5–15)
BUN: 14 mg/dL (ref 6–20)
CHLORIDE: 104 mmol/L (ref 101–111)
CO2: 23 mmol/L (ref 22–32)
CREATININE: 0.76 mg/dL (ref 0.44–1.00)
Calcium: 9.1 mg/dL (ref 8.9–10.3)
GFR calc non Af Amer: >60 mL/min (ref >60)
Glucose, Bld: 101 mg/dL — ABNORMAL HIGH (ref 65–99)
POTASSIUM: 3.8 mmol/L (ref 3.5–5.1)
SODIUM: 138 mmol/L (ref 135–145)

## 2015-05-19 NOTE — ED Notes (Signed)
Pt reports intermittent pain in chest x several months, radiating down L arm with numbness intermittently.  Denies additional symptoms or worsening factors.

## 2015-05-19 NOTE — Discharge Instructions (Signed)
Please read and follow all provided instructions.  Your diagnoses today include:  1. Chest pain, unspecified chest pain type     Tests performed today include:  An EKG of your heart - normal  A chest x-ray - normal  Cardiac enzymes - a blood test for heart muscle damage, no sign of heart attack  Blood counts and electrolytes  Vital signs. See below for your results today.   Medications prescribed:   None  Take any prescribed medications only as directed.  Follow-up instructions: Please follow-up with your primary care provider or the heart doctor listed as soon as you can for further evaluation of your symptoms.   Return instructions:  SEEK IMMEDIATE MEDICAL ATTENTION IF:  You have severe chest pain, especially if the pain is crushing or pressure-like and spreads to the arms, back, neck, or jaw, or if you have sweating, nausea (feeling sick to your stomach), or shortness of breath. THIS IS AN EMERGENCY. Don't wait to see if the pain will go away. Get medical help at once. Call 911 or 0 (operator). DO NOT drive yourself to the hospital.   Your chest pain gets worse and does not go away with rest.   You have an attack of chest pain lasting longer than usual, despite rest and treatment with the medications your caregiver has prescribed.   You wake from sleep with chest pain or shortness of breath.  You feel dizzy or faint.  You have chest pain not typical of your usual pain for which you originally saw your caregiver.   You have any other emergent concerns regarding your health.  Additional Information: Chest pain comes from many different causes. Your caregiver has diagnosed you as having chest pain that is not specific for one problem, but does not require admission.  You are at low risk for an acute heart condition or other serious illness.   Your vital signs today were: BP 116/87 mmHg   Pulse 73   Temp(Src) 97.6 F (36.4 C) (Oral)   Resp 23   Ht 5\' 6"  (1.676 m)   Wt  220 lb (99.791 kg)   BMI 35.53 kg/m2   SpO2 100%   LMP 05/19/2015 If your blood pressure (BP) was elevated above 135/85 this visit, please have this repeated by your doctor within one month. --------------

## 2015-05-19 NOTE — ED Provider Notes (Signed)
CSN: [id number]     Arrival date & time 05/19/15  1526 History   First MD Initiated Contact with Patient 05/19/15 1533     Chief Complaint  Patient presents with  . Chest Pain     (Consider location/radiation/quality/duration/timing/severity/associated sxs/prior Treatment) HPI Comments: Patient with history of smoking presents with complaint of chest pain which has been ongoing since last year intermittently. Patient describes a sharp pain in her left lateral chest which occasionally will radiate to her left shoulder. Pain is worse with movement of her arm and with certain positions. This morning she developed pain approximately 10:30 AM and was experiencing some mild sweating and lightheadedness with this. Patient was at rest when the pain began. Pain was similar in character to other episodes that she has had in the past. She has not noticed worsening symptoms with exertion. She did not have any palpitations, vomiting. No fever or cough. Patient denies risk factors for pulmonary embolism including: unilateral leg swelling, history of DVT/PE/other blood clots, use of estrogens, recent immobilizations, recent surgery, recent travel (>4hr segment), malignancy, hemoptysis. She has not seen a primary care physician or cardiologist for her chest pain. Patient denies history of hypertension, high cholesterol, diabetes, family history of heart disease.    Patient is a 39 y.o. female presenting with chest pain. The history is provided by the patient.  Chest Pain Associated symptoms: no abdominal pain, no back pain, no cough, no diaphoresis, no fever, no nausea, no palpitations, no shortness of breath and not vomiting     Past Medical History  Diagnosis Date  . Pancreatic cancer   . Back pain   . Anxiety   . Depression     No current treatment   Past Surgical History  Procedure Laterality Date  . Dilation and curettage of uterus    . Pancreas surgery      "Removed tip of pancreas"  . Tubal  ligation     Family History  Problem Relation Age of Onset  . Hypertension Father    History  Substance Use Topics  . Smoking status: Light Tobacco Smoker -- 0.25 packs/day    Types: Cigarettes  . Smokeless tobacco: Never Used  . Alcohol Use: No     Comment: Occas.   OB History    Gravida Para Term Preterm AB TAB SAB Ectopic Multiple Living   6 3 3  3 2 1   3      Review of Systems  Constitutional: Negative for fever and diaphoresis.  Eyes: Negative for redness.  Respiratory: Negative for cough and shortness of breath.   Cardiovascular: Positive for chest pain. Negative for palpitations and leg swelling.  Gastrointestinal: Negative for nausea, vomiting and abdominal pain.  Genitourinary: Negative for dysuria.  Musculoskeletal: Negative for back pain and neck pain.  Skin: Negative for rash.  Neurological: Negative for syncope and light-headedness.  Psychiatric/Behavioral: The patient is not nervous/anxious.     Allergies  Review of patient's allergies indicates no known allergies.  Home Medications   Prior to Admission medications   Medication Sig Start Date End Date Taking? Authorizing Provider  ALPRAZolam Duanne Moron) 0.25 MG tablet Take 0.25 mg by mouth at bedtime as needed for anxiety.   Yes Historical Provider, MD  FLUoxetine (PROZAC) 10 MG capsule Take 10 mg by mouth daily.   Yes Historical Provider, MD  acetaminophen (TYLENOL) 500 MG tablet Take 500 mg by mouth every 6 (six) hours as needed. Back and headache pain    Historical Provider,  MD  amoxicillin (AMOXIL) 500 MG capsule Take 1 capsule (500 mg total) by mouth 3 (three) times daily. 12/21/14   Dorie Rank, MD  benzonatate (TESSALON) 100 MG capsule Take 1 capsule (100 mg total) by mouth every 8 (eight) hours. 12/21/14   Dorie Rank, MD  BIOTIN 5000 PO Take by mouth. Hair, nails, and skin    Historical Provider, MD  gabapentin (NEURONTIN) 100 MG capsule Take 100 mg by mouth 3 (three) times daily. seizures    Historical  Provider, MD  Multiple Vitamin (MULTIVITAMIN) tablet Take 1 tablet by mouth daily.    Historical Provider, MD  promethazine (PHENERGAN) 25 MG tablet Take 0.5 tablets (12.5 mg total) by mouth every 6 (six) hours as needed for nausea. 09/14/12   Lisa A Leftwich-Kirby, CNM  vitamin E 100 UNIT capsule Take 100 Units by mouth daily.    Historical Provider, MD   BP 135/95 mmHg  Pulse 68  Temp(Src) 97.6 F (36.4 C) (Oral)  Resp 21  Ht 5\' 6"  (1.676 m)  Wt 220 lb (99.791 kg)  BMI 35.53 kg/m2  SpO2 100%  LMP 05/19/2015   Physical Exam  Constitutional: She appears well-developed and well-nourished.  HENT:  Head: Normocephalic and atraumatic.  Mouth/Throat: Mucous membranes are normal. Mucous membranes are not dry.  Eyes: Conjunctivae are normal.  Neck: Trachea normal and normal range of motion. Neck supple. Normal carotid pulses and no JVD present. No muscular tenderness present. Carotid bruit is not present. No tracheal deviation present.  Cardiovascular: Normal rate, regular rhythm, S1 normal, S2 normal, normal heart sounds and intact distal pulses.  Exam reveals no decreased pulses.   No murmur heard. Pulmonary/Chest: Effort normal. No respiratory distress. She has no wheezes. She exhibits tenderness. She exhibits no bony tenderness.    Abdominal: Soft. Normal aorta and bowel sounds are normal. There is no tenderness. There is no rebound and no guarding.  Musculoskeletal: Normal range of motion. She exhibits tenderness.  Neurological: She is alert.  Skin: Skin is warm and dry. She is not diaphoretic. No cyanosis. No pallor.  Psychiatric: She has a normal mood and affect.  Nursing note and vitals reviewed.   ED Course  Procedures (including critical care time) Labs Review Labs Reviewed  CBC WITH DIFFERENTIAL/PLATELET - Abnormal; Notable for the following:    Platelets 477 (*)    All other components within normal limits  BASIC METABOLIC PANEL - Abnormal; Notable for the following:      Glucose, Bld 101 (*)    All other components within normal limits  TROPONIN I  TROPONIN I    Imaging Review Dg Chest 2 View  05/19/2015   CLINICAL DATA:  Left-sided chest pain beginning this morning.  EXAM: CHEST  2 VIEW  COMPARISON:  12/21/2014  FINDINGS: Artifact overlies the chest. Heart size is normal. Mediastinal shadows are normal. The lungs are clear. The vascularity is normal. No effusions.  IMPRESSION: No active cardiopulmonary disease.   Electronically Signed   By: Nelson Chimes M.D.   On: 05/19/2015 16:12     EKG Interpretation   Date/Time:  Friday May 19 2015 15:40:18 EDT Ventricular Rate:  68 PR Interval:  142 QRS Duration: 84 QT Interval:  408 QTC Calculation: 433 R Axis:   42 Text Interpretation:  Normal sinus rhythm Normal ECG No old tracing to  compare Confirmed by KNAPP  MD-J, JON ([id number]) on 05/19/2015 4:05:42 PM      5:08 PM Patient seen and examined. Work-up initiated. Medications  ordered.   Vital signs reviewed and are as follows: BP 135/95 mmHg  Pulse 68  Temp(Src) 97.6 F (36.4 C) (Oral)  Resp 21  Ht 5\' 6"  (1.676 m)  Wt 220 lb (99.791 kg)  BMI 35.53 kg/m2  SpO2 100%  LMP 05/19/2015  5:47 PM Pt updated. She is doing well. Still with a little chest achiness, still worse with movement. Agrees to delta trop, d/c to home if neg.   7:07 PM Delta trop neg. Will d/c to home with PCP f/u.   Patient was counseled to return with severe chest pain, especially if the pain is crushing or pressure-like and spreads to the arms, back, neck, or jaw, or if they have sweating, nausea, or shortness of breath with the pain. They were encouraged to call 911 with these symptoms.   They were also told to return if their chest pain gets worse and does not go away with rest, they have an attack of chest pain lasting longer than usual despite rest and treatment with the medications their caregiver has prescribed, if they wake from sleep with chest pain or shortness of  breath, if they feel dizzy or faint, if they have chest pain not typical of their usual pain, or if they have any other emergent concerns regarding their health.  The patient verbalized understanding and agreed.     MDM   Final diagnoses:  Chest pain, unspecified chest pain type   Patient with atypical chest pain is positional in nature. She has had this for several months and is not exertional. Today her chest x-ray is negative, EKG is normal, troponin is negative 2. Delta troponin was ordered because of reported diaphoresis with her pain today. Patient appears well during ED stay. Appropriate referrals given. Return instructions discussed with patient at bedside.  No dangerous or life-threatening conditions suspected or identified by history, physical exam, and by work-up. No indications for hospitalization identified.      Carlisle Cater, PA-C 05/19/15 [place], MD 05/19/15 [phone]

## 2015-05-19 NOTE — ED Notes (Signed)
Patient transported to X-ray 

## 2020-09-29 ENCOUNTER — Other Ambulatory Visit: Payer: Self-pay

## 2020-09-29 ENCOUNTER — Emergency Department (INDEPENDENT_AMBULATORY_CARE_PROVIDER_SITE_OTHER): Payer: Medicaid Other

## 2020-09-29 ENCOUNTER — Emergency Department (INDEPENDENT_AMBULATORY_CARE_PROVIDER_SITE_OTHER)
Admission: EM | Admit: 2020-09-29 | Discharge: 2020-09-29 | Disposition: A | Discharge status: Home / Self Care | Payer: Medicaid Other | Source: Home / Self Care | Attending: Family Medicine | Admitting: Family Medicine

## 2020-09-29 DIAGNOSIS — M546 Pain in thoracic spine: Secondary | ICD-10-CM

## 2020-09-29 DIAGNOSIS — S239XXA Sprain of unspecified parts of thorax, initial encounter: Secondary | ICD-10-CM

## 2020-09-29 DIAGNOSIS — M545 Low back pain, unspecified: Secondary | ICD-10-CM | POA: Diagnosis not present

## 2020-09-29 DIAGNOSIS — S335XXA Sprain of ligaments of lumbar spine, initial encounter: Secondary | ICD-10-CM

## 2020-09-29 MED ORDER — CYCLOBENZAPRINE HCL 10 MG PO TABS: 10.0000 mg | ORAL_TABLET | Freq: Three times a day (TID) | ORAL | 0 refills | Status: AC | PRN

## 2020-09-29 MED ORDER — ACETAMINOPHEN 325 MG PO TABS
650.0000 mg | ORAL_TABLET | Freq: Once | ORAL | Status: AC
  Administered 2020-09-29: 650 mg via ORAL

## 2020-09-29 MED ORDER — NAPROXEN 500 MG PO TBEC: 500.0000 mg | DELAYED_RELEASE_TABLET | Freq: Two times a day (BID) | ORAL | 0 refills | Status: AC

## 2020-09-29 NOTE — Discharge Instructions (Addendum)
Apply ice pack for 20 to 30 minutes, 3 to 4 times daily  Continue until pain and swelling decrease.  Begin back range of motion and stretching exercises as tolerated. May take Tylenol as needed for pain.
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
